# Patient Record
Sex: Male | Born: 1980 | Race: Black or African American | Hispanic: No | Marital: Married | Smoking: Never smoker
Health system: Southern US, Community
[De-identification: ages and names within clinical notes are randomized; demographics above are authoritative.]

## PROBLEM LIST (undated history)

## (undated) DIAGNOSIS — E119 Type 2 diabetes mellitus without complications: Secondary | ICD-10-CM

## (undated) DIAGNOSIS — I10 Essential (primary) hypertension: Secondary | ICD-10-CM

---

## 2015-10-04 ENCOUNTER — Ambulatory Visit
Admission: EM | Admit: 2015-10-04 | Discharge: 2015-10-04 | Disposition: A | Discharge status: Home / Self Care | Payer: BC Managed Care – PPO | Attending: Family Medicine | Admitting: Family Medicine

## 2015-10-04 ENCOUNTER — Ambulatory Visit (INDEPENDENT_AMBULATORY_CARE_PROVIDER_SITE_OTHER): Payer: BC Managed Care – PPO

## 2015-10-04 ENCOUNTER — Encounter: Payer: Self-pay | Admitting: Emergency Medicine

## 2015-10-04 DIAGNOSIS — S93401A Sprain of unspecified ligament of right ankle, initial encounter: Secondary | ICD-10-CM

## 2015-10-04 DIAGNOSIS — M25571 Pain in right ankle and joints of right foot: Secondary | ICD-10-CM

## 2015-10-04 HISTORY — DX: Essential (primary) hypertension: I10

## 2015-10-04 HISTORY — DX: Type 2 diabetes mellitus without complications: E11.9

## 2015-10-04 MED ORDER — HYDROCODONE-ACETAMINOPHEN 5-325 MG PO TABS: ORAL_TABLET | ORAL | Status: AC

## 2015-10-04 NOTE — ED Provider Notes (Signed)
CSN: [id number]     Arrival date & time 10/04/15  1901 History   First MD Initiated Contact with Patient 10/04/15 1956     Chief Complaint  Patient presents with  . Ankle Pain   (Consider location/radiation/quality/duration/timing/severity/associated sxs/prior Treatment) HPI Comments: 35 yo morbidly obese, diabetic male  with a c/o 2-3 days h/o right ankle pain. Denies any falls, trauma, fevers, chills, redness, skin lesions, drainage.   The history is provided by the patient.    Past Medical History  Diagnosis Date  . Diabetes mellitus without complication (HCC)   . Hypertension    History reviewed. No pertinent past surgical history. History reviewed. No pertinent family history. Social History  Substance Use Topics  . Smoking status: Never Smoker   . Smokeless tobacco: None  . Alcohol Use: No    Review of Systems  Allergies  Penicillins  Home Medications   Prior to Admission medications   Medication Sig Start Date End Date Taking? Authorizing Provider  atorvastatin (LIPITOR) 80 MG tablet Take 80 mg by mouth daily.   Yes Historical Provider, MD  carvedilol (COREG) 12.5 MG tablet Take 12.5 mg by mouth 2 (two) times daily with a meal.   Yes Historical Provider, MD  chlorthalidone (HYGROTON) 25 MG tablet Take 25 mg by mouth daily.   Yes Historical Provider, MD  insulin aspart protamine- aspart (NOVOLOG MIX 70/30) (70-30) 100 UNIT/ML injection Inject into the skin.   Yes Historical Provider, MD  HYDROcodone-acetaminophen (NORCO/VICODIN) 5-325 MG tablet 1-2 tabs po q 8 hours prn 10/04/15   Payton Mccallum, MD   Meds Ordered and Administered this Visit  Medications - No data to display  BP 149/94 mmHg  Pulse 82  Temp(Src) 98.7 F (37.1 C) (Tympanic)  Resp 18  Ht 6' (1.829 m)  Wt 449 lb (203.665 kg)  BMI 60.88 kg/m2 No data found.   Physical Exam  Constitutional: He appears well-developed and well-nourished. No distress.  Musculoskeletal:       Right ankle: He  exhibits swelling. He exhibits normal range of motion, no ecchymosis, no deformity, no laceration and normal pulse. Tenderness. Lateral malleolus, medial malleolus and AITFL tenderness found. No CF ligament, no posterior TFL, no head of 5th metatarsal and no proximal fibula tenderness found. Achilles tendon normal.  Skin: He is not diaphoretic.  Nursing note and vitals reviewed.   ED Course  Procedures (including critical care time)  Labs Review Labs Reviewed - No data to display  Imaging Review Dg Ankle Complete Right  10/04/2015  CLINICAL DATA:  Posterior heel and ankle pain beginning 2 days ago EXAM: RIGHT ANKLE - COMPLETE 3+ VIEW COMPARISON:  None. FINDINGS: No fracture or dislocation. Joint spaces are preserved. The ankle mortise is preserved. Suspected small ankle joint effusion. Small plantar calcaneal spur. Minimal enthesopathic change involving the Achilles tendon insertion site. Kager's fat pad is preserved. Regional soft tissues appear otherwise normal. No radiopaque foreign body. IMPRESSION: 1. Small ankle joint effusion.  Otherwise, no acute findings. 2. Small plantar calcaneal spur. 3. Minimal enthesopathic change involving the Achilles tendon insertion site. Electronically Signed   By: Simonne Come M.D.   On: 10/04/2015 20:24     Visual Acuity Review  Right Eye Distance:   Left Eye Distance:   Bilateral Distance:    Right Eye Near:   Left Eye Near:    Bilateral Near:         MDM   1. Ankle pain, right   2. Ankle sprain, right,  initial encounter    New Prescriptions   HYDROCODONE-ACETAMINOPHEN (NORCO/VICODIN) 5-325 MG TABLET    1-2 tabs po q 8 hours prn   1. x-ray results and diagnosis reviewed with patient 2. rx as per orders above; reviewed possible side effects, interactions, risks and benefits  3. Recommend supportive treatment with rest, ice, elevation, otc analgesics/NSAIDs prn 4. Follow-up prn if symptoms worsen or don't improve   Payton Mccallumrlando Demico Ploch,  MD 10/04/15 2052

## 2015-10-04 NOTE — Discharge Instructions (Signed)
Ankle Pain Ankle pain is a common symptom. The bones, cartilage, tendons, and muscles of the ankle joint perform a lot of work each day. The ankle joint holds your body weight and allows you to move around. Ankle pain can occur on either side or back of 1 or both ankles. Ankle pain may be sharp and burning or dull and aching. There may be tenderness, stiffness, redness, or warmth around the ankle. The pain occurs more often when a person walks or puts pressure on the ankle. CAUSES  There are many reasons ankle pain can develop. It is important to work with your caregiver to identify the cause since many conditions can impact the bones, cartilage, muscles, and tendons. Causes for ankle pain include:  Injury, including a break (fracture), sprain, or strain often due to a fall, sports, or a high-impact activity.  Swelling (inflammation) of a tendon (tendonitis).  Achilles tendon rupture.  Ankle instability after repeated sprains and strains.  Poor foot alignment.  Pressure on a nerve (tarsal tunnel syndrome).  Arthritis in the ankle or the lining of the ankle.  Crystal formation in the ankle (gout or pseudogout). DIAGNOSIS  A diagnosis is based on your medical history, your symptoms, results of your physical exam, and results of diagnostic tests. Diagnostic tests may include X-ray exams or a computerized magnetic scan (magnetic resonance imaging, MRI). TREATMENT  Treatment will depend on the cause of your ankle pain and may include:  Keeping pressure off the ankle and limiting activities.  Using crutches or other walking support (a cane or brace).  Using rest, ice, compression, and elevation.  Participating in physical therapy or home exercises.  Wearing shoe inserts or special shoes.  Losing weight.  Taking medications to reduce pain or swelling or receiving an injection.  Undergoing surgery. HOME CARE INSTRUCTIONS   Only take over-the-counter or prescription medicines for  pain, discomfort, or fever as directed by your caregiver.  Put ice on the injured area.  Put ice in a plastic bag.  Place a towel between your skin and the bag.  Leave the ice on for 15-20 minutes at a time, 03-04 times a day.  Keep your leg raised (elevated) when possible to lessen swelling.  Avoid activities that cause ankle pain.  Follow specific exercises as directed by your caregiver.  Record how often you have ankle pain, the location of the pain, and what it feels like. This information may be helpful to you and your caregiver.  Ask your caregiver about returning to work or sports and whether you should drive.  Follow up with your caregiver for further examination, therapy, or testing as directed. SEEK MEDICAL CARE IF:   Pain or swelling continues or worsens beyond 1 week.  You have an oral temperature above 102 F (38.9 C).  You are feeling unwell or have chills.  You are having an increasingly difficult time with walking.  You have loss of sensation or other new symptoms.  You have questions or concerns. MAKE SURE YOU:   Understand these instructions.  Will watch your condition.  Will get help right away if you are not doing well or get worse.   This information is not intended to replace advice given to you by your health care provider. Make sure you discuss any questions you have with your health care provider.   Document Released: 11/08/2009 Document Revised: 08/13/2011 Document Reviewed: 12/21/2014 Elsevier Interactive Patient Education 2016 [employer]  Ankle Sprain An ankle sprain is an injury  to the strong, fibrous tissues (ligaments) that hold your ankle bones together.  HOME CARE   Put ice on your ankle for 1-2 days or as told by your doctor.  Put ice in a plastic bag.  Place a towel between your skin and the bag.  Leave the ice on for 15-20 minutes at a time, every 2 hours while you are awake.  Only take medicine as told by your  doctor.  Raise (elevate) your injured ankle above the level of your heart as much as possible for 2-3 days.  Use crutches if your doctor tells you to. Slowly put your own weight on the affected ankle. Use the crutches until you can walk without pain.  If you have a plaster splint:  Do not rest it on anything harder than a pillow for 24 hours.  Do not put weight on it.  Do not get it wet.  Take it off to shower or bathe.  If given, use an elastic wrap or support stocking for support. Take the wrap off if your toes lose feeling (numb), tingle, or turn cold or blue.  If you have an air splint:  Add or let out air to make it comfortable.  Take it off at night and to shower and bathe.  Wiggle your toes and move your ankle up and down often while you are wearing it. GET HELP IF:  You have rapidly increasing bruising or puffiness (swelling).  Your toes feel very cold.  You lose feeling in your foot.  Your medicine does not help your pain. GET HELP RIGHT AWAY IF:   Your toes lose feeling (numb) or turn blue.  You have severe pain that is increasing. MAKE SURE YOU:   Understand these instructions.  Will watch your condition.  Will get help right away if you are not doing well or get worse.   This information is not intended to replace advice given to you by your health care provider. Make sure you discuss any questions you have with your health care provider.   Document Released: 11/07/2007 Document Revised: 06/11/2014 Document Reviewed: 12/03/2011 Elsevier Interactive Patient Education [employer]

## 2015-10-04 NOTE — ED Notes (Signed)
Patient states he developed heel and ankle pain 2 days ago

## 2015-10-17 ENCOUNTER — Ambulatory Visit
Admission: EM | Admit: 2015-10-17 | Discharge: 2015-10-17 | Discharge status: Absent without leave | Payer: BC Managed Care – PPO

## 2017-09-06 IMAGING — CR DG ANKLE COMPLETE 3+V*R*
3 series · 3 of 3 positions shown · non-contrast
Comparison: None.

CLINICAL DATA: Posterior heel and ankle pain beginning 2 days ago

EXAM:
RIGHT ANKLE - COMPLETE 3+ VIEW

[ankle ap]
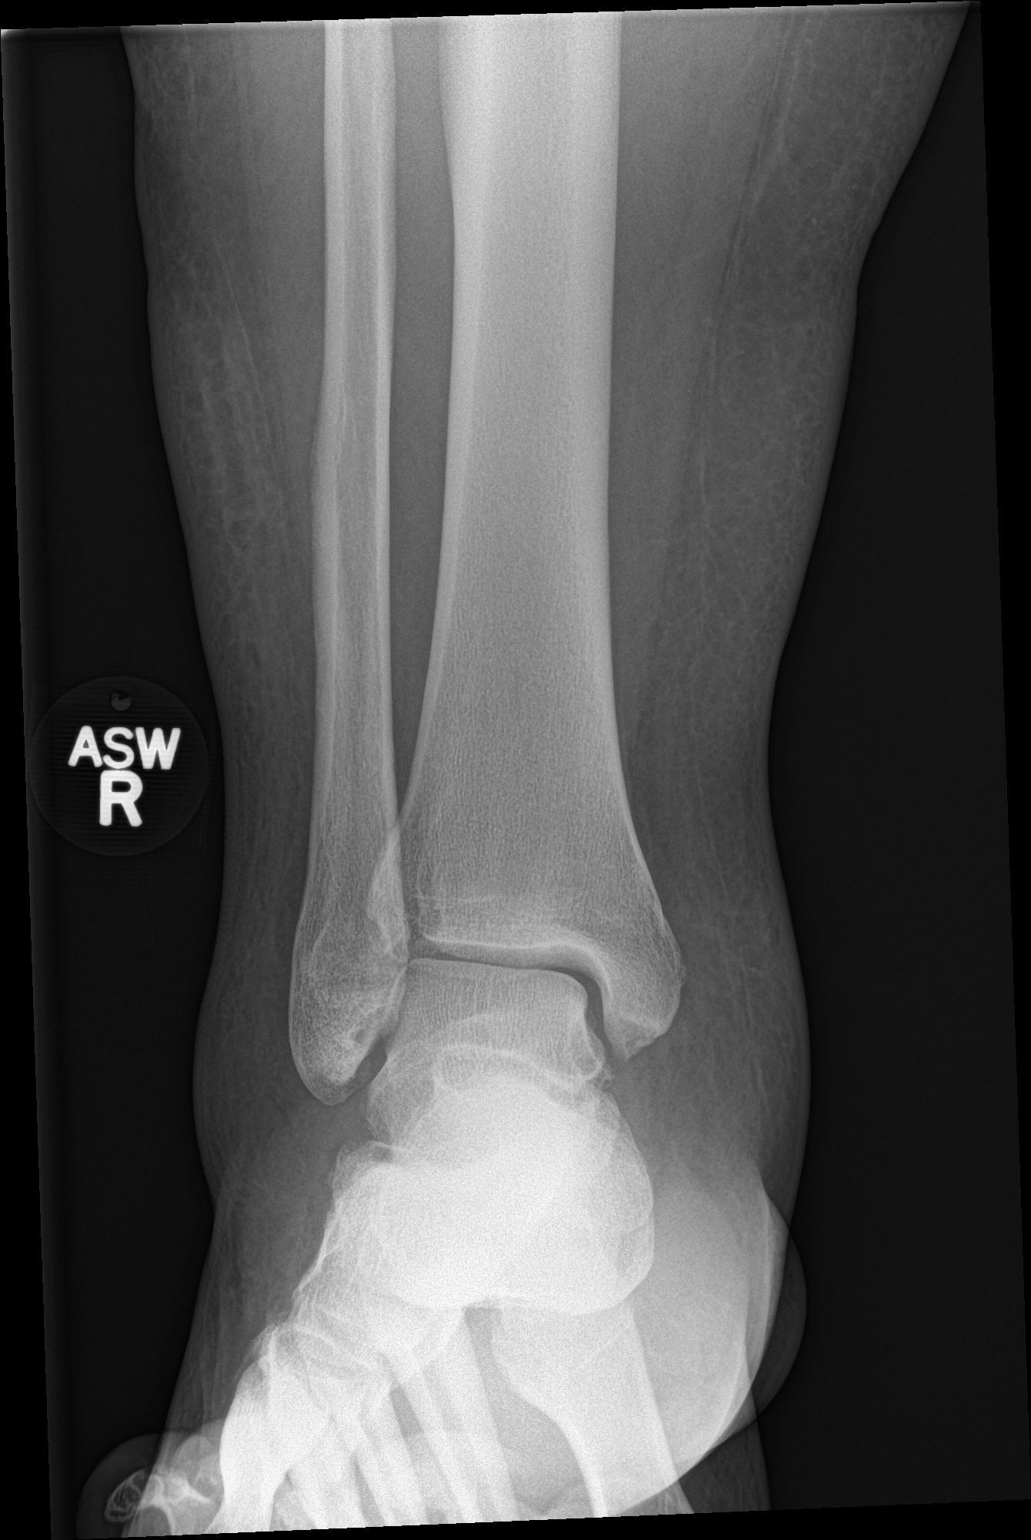

[ankle obl]
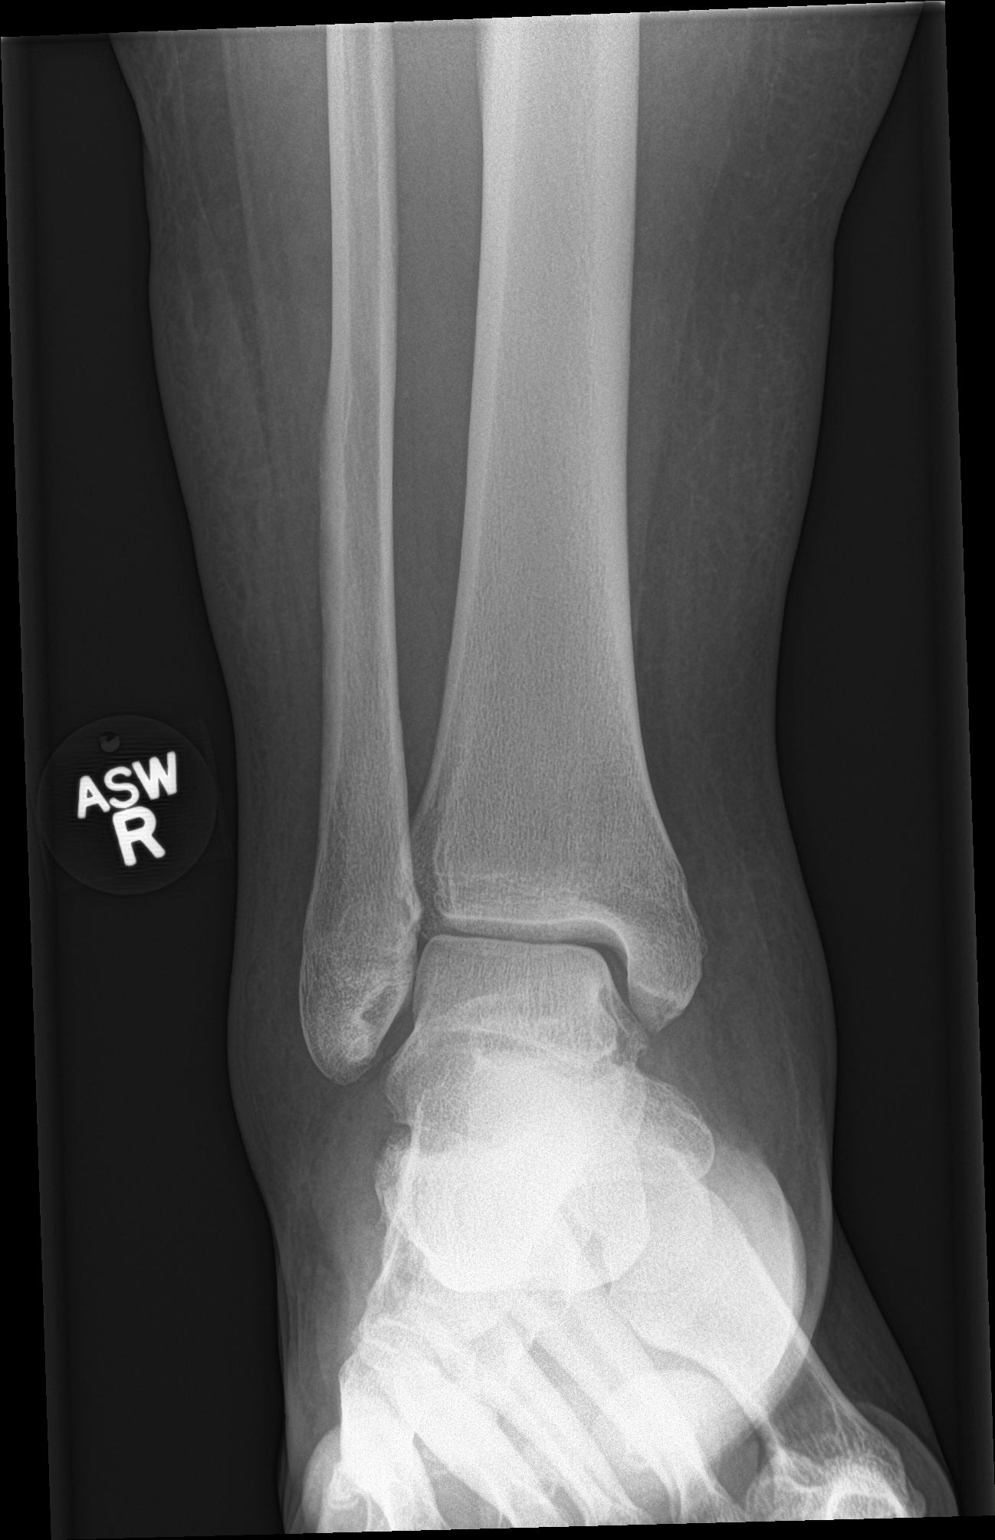

[ankle lat]
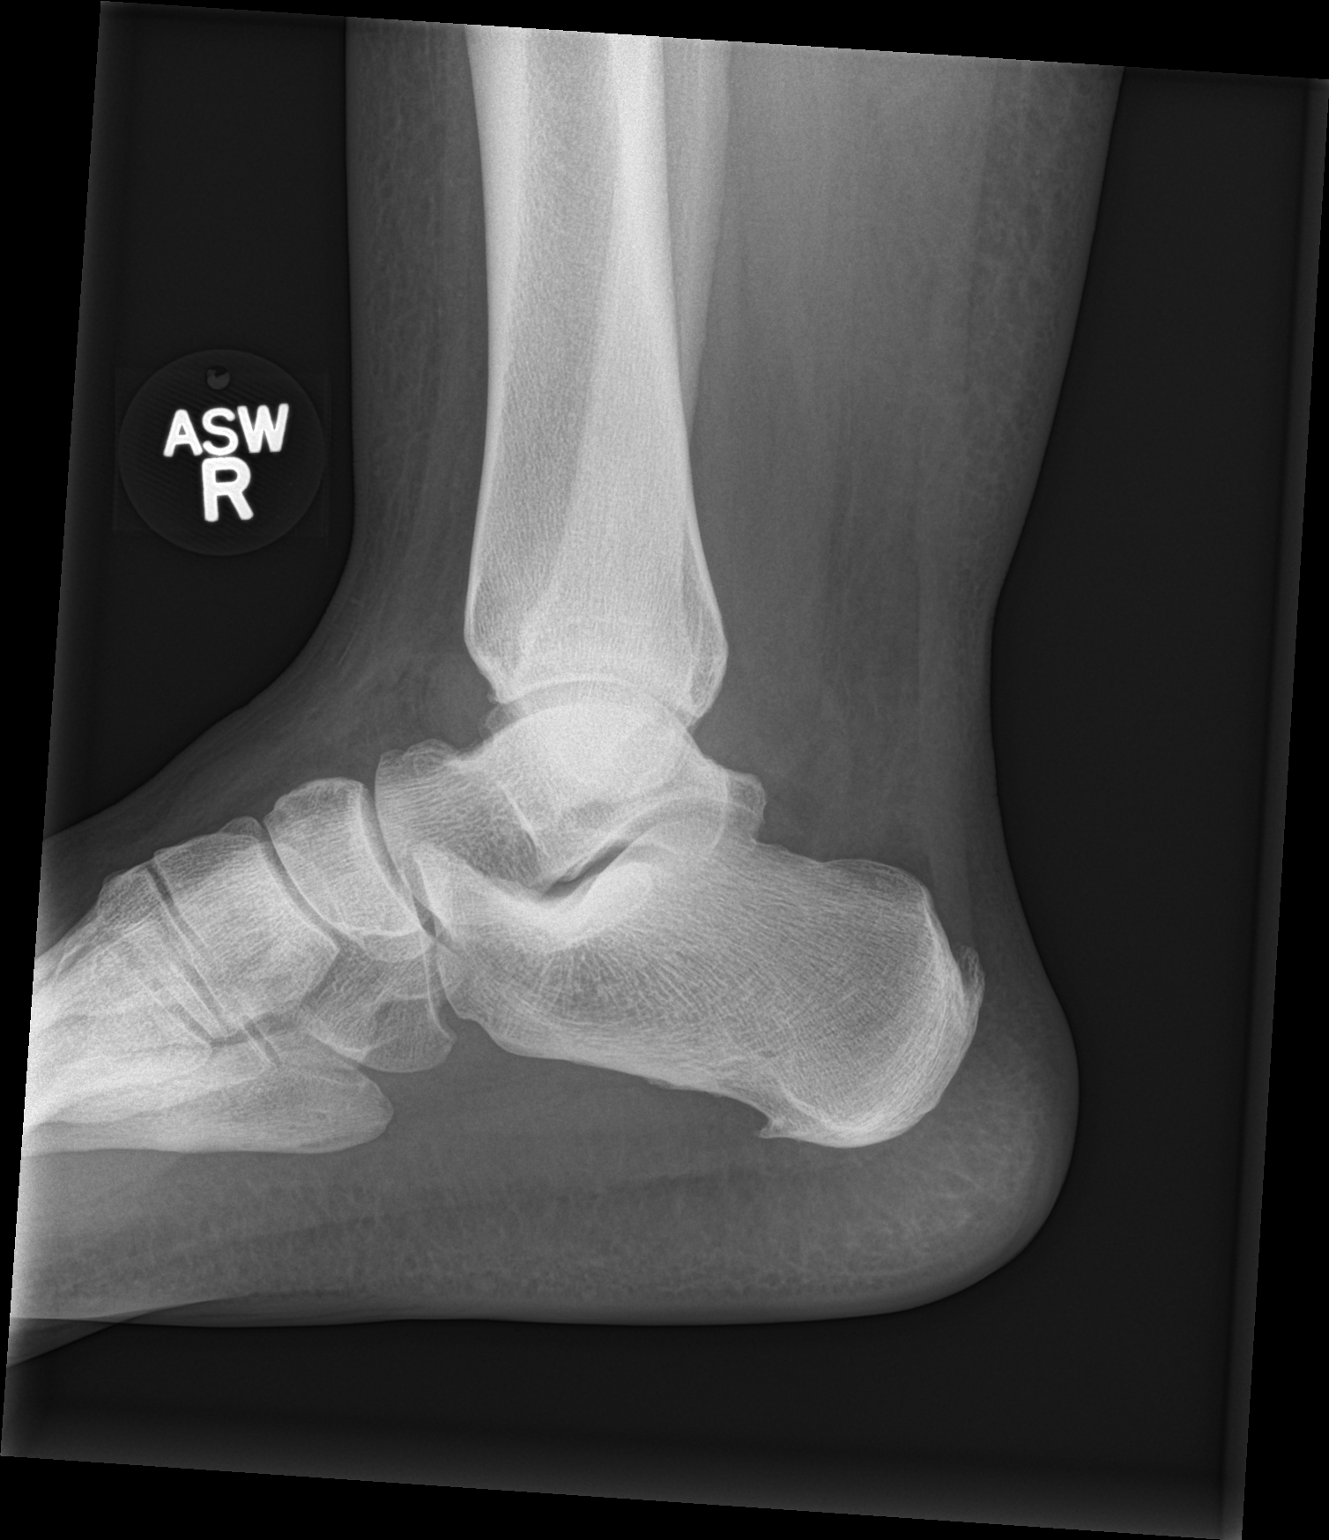

[3 of 3 positions shown; findings below may reference images not displayed]

FINDINGS: No fracture or dislocation. Joint spaces are preserved. The ankle
mortise is preserved. Suspected small ankle joint effusion. Small
plantar calcaneal spur. Minimal enthesopathic change involving the
Achilles tendon insertion site. Kager's fat pad is preserved.
Regional soft tissues appear otherwise normal. No radiopaque foreign
body.
IMPRESSION: 1. Small ankle joint effusion.  Otherwise, no acute findings.
2. Small plantar calcaneal spur.
3. Minimal enthesopathic change involving the Achilles tendon
insertion site.
